# Patient Record
Sex: Female | Born: 1935 | Race: White | Hispanic: No | Smoking: Never smoker
Health system: Southern US, Community
[De-identification: ages and names within clinical notes are randomized; demographics above are authoritative.]

## PROBLEM LIST (undated history)

## (undated) DIAGNOSIS — I10 Essential (primary) hypertension: Secondary | ICD-10-CM

## (undated) HISTORY — PX: WRIST SURGERY: SHX841

## (undated) HISTORY — DX: Essential (primary) hypertension: I10

---

## 1998-11-17 ENCOUNTER — Other Ambulatory Visit: Admission: RE | Admit: 1998-11-17 | Discharge: 1998-11-17 | Payer: Self-pay | Admitting: *Deleted

## 1999-12-06 ENCOUNTER — Other Ambulatory Visit: Admission: RE | Admit: 1999-12-06 | Discharge: 1999-12-06 | Payer: Self-pay | Admitting: *Deleted

## 2000-12-10 ENCOUNTER — Other Ambulatory Visit: Admission: RE | Admit: 2000-12-10 | Discharge: 2000-12-10 | Payer: Self-pay | Admitting: Obstetrics and Gynecology

## 2001-12-13 ENCOUNTER — Other Ambulatory Visit: Admission: RE | Admit: 2001-12-13 | Discharge: 2001-12-13 | Payer: Self-pay | Admitting: Obstetrics and Gynecology

## 2002-03-13 ENCOUNTER — Ambulatory Visit (HOSPITAL_COMMUNITY): Admission: RE | Admit: 2002-03-13 | Discharge: 2002-03-13 | Payer: Self-pay | Admitting: Gastroenterology

## 2002-03-13 ENCOUNTER — Encounter (INDEPENDENT_AMBULATORY_CARE_PROVIDER_SITE_OTHER): Payer: Self-pay | Admitting: Specialist

## 2003-02-17 ENCOUNTER — Other Ambulatory Visit: Admission: RE | Admit: 2003-02-17 | Discharge: 2003-02-17 | Payer: Self-pay | Admitting: Obstetrics and Gynecology

## 2005-05-03 ENCOUNTER — Other Ambulatory Visit: Admission: RE | Admit: 2005-05-03 | Discharge: 2005-05-03 | Payer: Self-pay | Admitting: *Deleted

## 2006-05-08 ENCOUNTER — Other Ambulatory Visit: Admission: RE | Admit: 2006-05-08 | Discharge: 2006-05-08 | Payer: Self-pay | Admitting: Obstetrics and Gynecology

## 2007-05-14 ENCOUNTER — Other Ambulatory Visit: Admission: RE | Admit: 2007-05-14 | Discharge: 2007-05-14 | Payer: Self-pay | Admitting: Obstetrics and Gynecology

## 2008-05-14 ENCOUNTER — Other Ambulatory Visit: Admission: RE | Admit: 2008-05-14 | Discharge: 2008-05-14 | Payer: Self-pay | Admitting: Obstetrics and Gynecology

## 2009-04-09 ENCOUNTER — Encounter: Admission: RE | Admit: 2009-04-09 | Discharge: 2009-04-09 | Payer: Self-pay | Admitting: Internal Medicine

## 2010-05-19 ENCOUNTER — Encounter: Admission: RE | Admit: 2010-05-19 | Discharge: 2010-05-19 | Payer: Self-pay | Admitting: Internal Medicine

## 2010-06-02 HISTORY — PX: REPLACEMENT TOTAL KNEE: SUR1224

## 2010-06-13 ENCOUNTER — Inpatient Hospital Stay (HOSPITAL_COMMUNITY): Admission: RE | Admit: 2010-06-13 | Discharge: 2010-06-16 | Payer: Self-pay | Admitting: Orthopedic Surgery

## 2010-10-24 ENCOUNTER — Encounter: Payer: Self-pay | Admitting: Internal Medicine

## 2010-12-15 LAB — BASIC METABOLIC PANEL
BUN: 8 mg/dL (ref 6–23)
Calcium: 8.3 mg/dL — ABNORMAL LOW (ref 8.4–10.5)
Calcium: 8.3 mg/dL — ABNORMAL LOW (ref 8.4–10.5)
Creatinine, Ser: 0.55 mg/dL (ref 0.4–1.2)
Creatinine, Ser: 0.58 mg/dL (ref 0.4–1.2)
Creatinine, Ser: 0.63 mg/dL (ref 0.4–1.2)
GFR calc non Af Amer: >60 mL/min (ref >60)
GFR calc non Af Amer: >60 mL/min (ref >60)
Glucose, Bld: 93 mg/dL (ref 70–99)
Glucose, Bld: 130 mg/dL — ABNORMAL HIGH (ref 70–99)
Potassium: 3.5 mEq/L (ref 3.5–5.1)
Sodium: 126 mEq/L — ABNORMAL LOW (ref 135–145)
Sodium: 128 mEq/L — ABNORMAL LOW (ref 135–145)

## 2010-12-15 LAB — URINE CULTURE
Colony Count: NO GROWTH
Culture  Setup Time: 201109120901

## 2010-12-15 LAB — APTT: aPTT: 32 seconds (ref 24–37)

## 2010-12-15 LAB — CBC
HCT: 28 % — ABNORMAL LOW (ref 36.0–46.0)
HCT: 28.3 % — ABNORMAL LOW (ref 36.0–46.0)
HCT: 32.1 % — ABNORMAL LOW (ref 36.0–46.0)
Hemoglobin: 14.1 g/dL (ref 12.0–15.0)
MCH: 31.6 pg (ref 26.0–34.0)
MCHC: 35.2 g/dL (ref 30.0–36.0)
MCHC: 35.3 g/dL (ref 30.0–36.0)
MCV: 88.3 fL (ref 78.0–100.0)
MCV: 89.7 fL (ref 78.0–100.0)
Platelets: 197 10*3/uL (ref 150–400)
Platelets: 179 10*3/uL (ref 150–400)
Platelets: 197 10*3/uL (ref 150–400)
RBC: 3.19 MIL/uL — ABNORMAL LOW (ref 3.87–5.11)
RBC: 3.16 MIL/uL — ABNORMAL LOW (ref 3.87–5.11)
RBC: 3.58 MIL/uL — ABNORMAL LOW (ref 3.87–5.11)
RBC: 4.52 MIL/uL (ref 3.87–5.11)
RDW: 12.8 % (ref 11.5–15.5)
RDW: 12.8 % (ref 11.5–15.5)
WBC: 12.3 10*3/uL — ABNORMAL HIGH (ref 4.0–10.5)
WBC: 9.2 10*3/uL (ref 4.0–10.5)

## 2010-12-15 LAB — COMPREHENSIVE METABOLIC PANEL
ALT: 19 U/L (ref 0–35)
Alkaline Phosphatase: 74 U/L (ref 39–117)
BUN: 11 mg/dL (ref 6–23)
Chloride: 92 mEq/L — ABNORMAL LOW (ref 96–112)
Creatinine, Ser: 0.67 mg/dL (ref 0.4–1.2)
Potassium: 4.4 mEq/L (ref 3.5–5.1)
Sodium: 129 mEq/L — ABNORMAL LOW (ref 135–145)
Total Bilirubin: 0.3 mg/dL (ref 0.3–1.2)

## 2010-12-15 LAB — CROSSMATCH: ABO/RH(D): B POS

## 2010-12-15 LAB — SURGICAL PCR SCREEN
MRSA, PCR: NEGATIVE
Staphylococcus aureus: NEGATIVE

## 2010-12-15 LAB — ABO/RH: ABO/RH(D): B POS

## 2010-12-15 LAB — DIFFERENTIAL
Lymphs Abs: 2.6 10*3/uL (ref 0.7–4.0)
Monocytes Relative: 8 % (ref 3–12)
Neutro Abs: 5.7 10*3/uL (ref 1.7–7.7)

## 2011-02-17 NOTE — Procedures (Signed)
[facility]  Patient:    Kayla Ellis, Kayla Ellis Visit Number: [id number] MRN: [id number]          Service Type: END Location: ENDO Attending Physician:  Rich Brave Dictated by:   Florencia Reasons, M.D. Proc. Date: 03/13/02 Admit Date:  03/13/2002 Discharge Date: 03/13/2002   CC:         Erskine Speed, M.D.   Procedure Report  PROCEDURE:  Colonoscopy with biopsies.  SURGEON:  Florencia Reasons, M.D.  INDICATIONS:  A 77 year old female for colon cancer screening.  She does have a history of intermittent diarrhea.  FINDINGS:  Left-sided diverticulosis.  DESCRIPTION OF PROCEDURE:  The nature, purpose, and risks of the procedure had been discussed with the patient who provided written consent.  Sedation is fentanyl 50 mcg and Versed 6 mg IV without arrhythmias or desaturation.  The Olympus adjustable tension pediatric video colonoscope was easily advanced around the colon to the cecum, applying some external abdominal compression to get the tip of the scope to pass into the base of the cecum as identified by visualization of typical cecal appearance.  Pull back was then performed.  The quality of the prep was very good and it was felt that all areas were well-seen.  This was a normal examination, including retroflexed viewing in the rectum. No polyps, cancer, colitis, vascular malformations or diverticular disease were observed.  It is presumed that the patients intermittent diarrhea is due to irritable bowel syndrome, but random biopsies were obtained along the length of the colon to help exclude microscopic or collagenous colitis, and in so doing, the majority of the colon was briefly reinspected.  The patient tolerated the procedure well and without apparent complications.  IMPRESSION: 1. Left-sided diverticulosis. 2. No evidence of polyps or other precancerous lesions. 3. Source of diarrhea not endoscopically evident.  Suspect  irritable bowel    syndrome.  PLAN:  Await pathology to confirm the absence of microscopic or collagenous colitis, and the patient will be a candidate for follow up colonoscopy in ten years.  If she remains in good general health, and at the discretion of her primary physician, consideration could be given to flexible sigmoidoscopy five years from now, which would be covered by Medicare and would offer additional screening benefit. Dictated by:   Florencia Reasons, M.D. Attending Physician:  Rich Brave DD:  03/13/02 TD:  03/15/02 Job: [id number]

## 2011-06-14 ENCOUNTER — Other Ambulatory Visit: Payer: Self-pay | Admitting: Internal Medicine

## 2011-06-14 DIAGNOSIS — Z1231 Encounter for screening mammogram for malignant neoplasm of breast: Secondary | ICD-10-CM

## 2011-08-17 ENCOUNTER — Ambulatory Visit: Payer: Self-pay

## 2011-09-13 ENCOUNTER — Ambulatory Visit: Payer: Self-pay

## 2011-09-13 ENCOUNTER — Ambulatory Visit
Admission: RE | Admit: 2011-09-13 | Discharge: 2011-09-13 | Disposition: A | Discharge status: Home / Self Care | Payer: Medicare Other | Source: Ambulatory Visit | Attending: Internal Medicine | Admitting: Internal Medicine

## 2011-09-13 DIAGNOSIS — Z1231 Encounter for screening mammogram for malignant neoplasm of breast: Secondary | ICD-10-CM

## 2012-02-07 ENCOUNTER — Other Ambulatory Visit: Payer: Self-pay | Admitting: Internal Medicine

## 2012-02-07 DIAGNOSIS — Z780 Asymptomatic menopausal state: Secondary | ICD-10-CM

## 2012-02-07 DIAGNOSIS — Z1231 Encounter for screening mammogram for malignant neoplasm of breast: Secondary | ICD-10-CM

## 2012-03-25 ENCOUNTER — Other Ambulatory Visit: Payer: Self-pay | Admitting: Gastroenterology

## 2012-09-16 ENCOUNTER — Other Ambulatory Visit: Payer: Medicare Other

## 2012-09-16 ENCOUNTER — Ambulatory Visit
Admission: RE | Admit: 2012-09-16 | Discharge: 2012-09-16 | Disposition: A | Discharge status: Home / Self Care | Payer: Self-pay | Source: Ambulatory Visit | Attending: Internal Medicine | Admitting: Internal Medicine

## 2012-09-16 DIAGNOSIS — Z1231 Encounter for screening mammogram for malignant neoplasm of breast: Secondary | ICD-10-CM

## 2012-09-16 DIAGNOSIS — Z780 Asymptomatic menopausal state: Secondary | ICD-10-CM

## 2013-10-03 ENCOUNTER — Other Ambulatory Visit: Payer: Self-pay

## 2013-10-03 DIAGNOSIS — Z1231 Encounter for screening mammogram for malignant neoplasm of breast: Secondary | ICD-10-CM

## 2013-10-28 ENCOUNTER — Ambulatory Visit
Admission: RE | Admit: 2013-10-28 | Discharge: 2013-10-28 | Disposition: A | Discharge status: Home / Self Care | Payer: Medicare Other | Source: Ambulatory Visit

## 2013-10-28 DIAGNOSIS — Z1231 Encounter for screening mammogram for malignant neoplasm of breast: Secondary | ICD-10-CM

## 2013-12-10 ENCOUNTER — Encounter: Payer: Self-pay | Admitting: Obstetrics & Gynecology

## 2013-12-10 ENCOUNTER — Ambulatory Visit: Payer: Self-pay | Admitting: Obstetrics and Gynecology

## 2013-12-11 ENCOUNTER — Encounter: Payer: Self-pay | Admitting: Obstetrics & Gynecology

## 2013-12-11 ENCOUNTER — Ambulatory Visit (INDEPENDENT_AMBULATORY_CARE_PROVIDER_SITE_OTHER): Payer: Medicare Other | Admitting: Obstetrics & Gynecology

## 2013-12-11 VITALS — BP 138/82 | HR 55 | Resp 18 | Ht 63.0 in | Wt 178.0 lb

## 2013-12-11 DIAGNOSIS — Z124 Encounter for screening for malignant neoplasm of cervix: Secondary | ICD-10-CM

## 2013-12-11 DIAGNOSIS — Z01419 Encounter for gynecological examination (general) (routine) without abnormal findings: Secondary | ICD-10-CM

## 2013-12-11 DIAGNOSIS — R1031 Right lower quadrant pain: Secondary | ICD-10-CM

## 2013-12-11 NOTE — Progress Notes (Signed)
80 y.o. G1P1 MarriedCaucasianF here for annual exam.  No vaginal bleeding.  After standing for extended periods of time, has some RLQ pain. No nausea, diarrhea, or constipation.  Has been going on for about six months.  Hasn't really complained of this to anyone else except her husband.  Patient's last menstrual period was 10/02/1982.          Sexually active: no  The current method of family planning is none.    Exercising: yes  Walking a mile 5 days a week Smoker:  no  Health Maintenance: Pap:  05/2009 - Neg History of abnormal Pap:  no MMG:  10/29/2013 BI-RADS1: Neg Colonoscopy:  06/2012.  One polyp.  Dr. Cristina Gong.  F/u 5 years. BMD:   05/2010 TDaP:  2013 - PCP Screening Labs: PCP, Hb today: PCP, Urine today: PCP   reports that she has never smoked. She has never used smokeless tobacco. She reports that she does not drink alcohol or use illicit drugs.  Past Medical History  Diagnosis Date  . Hypertension     Past Surgical History  Procedure Laterality Date  . Wrist surgery      right  . Replacement total knee      Current Outpatient Prescriptions  Medication Sig Dispense Refill  . Calcium Carb-Cholecalciferol (CALCIUM 600 + D PO) Take by mouth 2 (two) times daily.      . carvedilol (COREG) 25 MG tablet Take 25 mg by mouth 2 (two) times daily with a meal.       . desonide (DESOWEN) 0.05 % cream Apply 1 application topically 2 (two) times daily.      . diphenhydramine-acetaminophen (TYLENOL PM) 25-500 MG TABS Take 1 tablet by mouth at bedtime as needed.      Marland Kitchen estradiol (ESTRACE) 0.1 MG/GM vaginal cream Place 1 Applicatorful vaginally as needed.       . FUROSEMIDE PO Take 40 mg by mouth daily.       Marland Kitchen levothyroxine (SYNTHROID, LEVOTHROID) 50 MCG tablet Take 1 tablet by mouth daily.      Marland Kitchen losartan (COZAAR) 100 MG tablet Take 100 mg by mouth daily.       No current facility-administered medications for this visit.    Family History  Problem Relation Age of Onset  .  Cancer Mother     vulvar  . Alzheimer's disease Mother   . Lung cancer Father   . Heart attack Brother   . Emphysema Father     ROS:  Pertinent items are noted in HPI.  Otherwise, a comprehensive ROS was negative.  Exam:   BP 138/82  Pulse 55  Resp 18  Ht 5\' 3"  (1.6 m)  Wt 178 lb (80.74 kg)  BMI 31.54 kg/m2  LMP 10/02/1982  Weight change: -6lb  Height: 5\' 3"  (160 cm)  Ht Readings from Last 3 Encounters:  12/11/13 5\' 3"  (1.6 m)    General appearance: alert, cooperative and appears stated age Head: Normocephalic, without obvious abnormality, atraumatic Neck: no adenopathy, supple, symmetrical, trachea midline and thyroid normal to inspection and palpation Lungs: clear to auscultation bilaterally Breasts: normal appearance, no masses or tenderness Heart: regular rate and rhythm Abdomen: soft, non-tender; bowel sounds normal; no masses,  no organomegaly Extremities: extremities normal, atraumatic, no cyanosis or edema Skin: Skin color, texture, turgor normal. No rashes or lesions Lymph nodes: Cervical, supraclavicular, and axillary nodes normal. No abnormal inguinal nodes palpated Neurologic: Grossly normal   Pelvic: External genitalia:  no lesions  Urethra:  normal appearing urethra with no masses, tenderness or lesions              Bartholins and Skenes: normal                 Vagina: normal appearing vagina with normal color and discharge, no lesions              Cervix: no lesions              Pap taken: yes Bimanual Exam:  Uterus:  normal size, contour, position, consistency, mobility, non-tender              Adnexa: normal adnexa and no mass, fullness, tenderness               Rectovaginal: Confirms               Anus:  normal sphincter tone, no lesions  A:  Well Woman with normal exam PMP, no HRT Hypertension Intermittent RLQ pain, primarily with extended standing.  P:   Mammogram yearly pap smear today Plan PUS just to ensure ovaries are normal.    return annually or prn  An After Visit Summary was printed and given to the patient.

## 2013-12-11 NOTE — Progress Notes (Signed)
Pelvic U/S scheduled and patient aware/agreeable to time.  Patient verbalized understanding of the U/S appointment cancellation policy. Advised will need to cancel within 72 business hours (3 business days) or will have $100.00 no show fee placed to account.  Scheduled for 3/19 at 1530

## 2013-12-15 LAB — IPS PAP SMEAR ONLY

## 2013-12-18 ENCOUNTER — Ambulatory Visit (INDEPENDENT_AMBULATORY_CARE_PROVIDER_SITE_OTHER): Payer: Medicare Other

## 2013-12-18 ENCOUNTER — Ambulatory Visit (INDEPENDENT_AMBULATORY_CARE_PROVIDER_SITE_OTHER): Payer: Medicare Other | Admitting: Obstetrics & Gynecology

## 2013-12-18 VITALS — BP 122/82 | Ht 63.0 in | Wt 176.0 lb

## 2013-12-18 DIAGNOSIS — R1031 Right lower quadrant pain: Secondary | ICD-10-CM

## 2013-12-18 DIAGNOSIS — N83209 Unspecified ovarian cyst, unspecified side: Secondary | ICD-10-CM

## 2013-12-18 NOTE — Progress Notes (Signed)
  80 y.o.Marriedfemale here for a pelvic ultrasound.    Patient's last menstrual period was 10/02/1982.  Sexually active:  no  Contraception: PMP state  FINDINGS: UTERUS: 4.6 x 3.6 x 2.3cm EMS: 3.64mm ADNEXA:   Left ovary 2.7 x 1.8 x 0.9cm with 1.8cm smooth, echofree and avascular cyst noted   Right ovary 1.7 x 1.0 x 1.2cm CUL DE SAC: no free fluid  Images reviewed with patient.  All questions answered.  Has a small, benign appearing cyst that would not be source of pain.  Feel her pain may be joint related and I would like for her to see her orthopedist.  H/O Right total knee so this pain could be hip related.  She is reassured by ultrasound today and will discuss at next appt.  Will also let me know if anything changes.    Assessment:  RLQ pain Plan: small 1.8cm left ovarian cyst, benign in appearance Pt will follow up with ortho  ~15 minutes spent with patient >50% of time was in face to face discussion of above.

## 2013-12-30 ENCOUNTER — Encounter: Payer: Self-pay | Admitting: Obstetrics & Gynecology

## 2014-05-02 HISTORY — PX: CATARACT EXTRACTION: SUR2

## 2014-07-02 HISTORY — PX: CATARACT EXTRACTION: SUR2

## 2014-08-03 ENCOUNTER — Encounter: Payer: Self-pay | Admitting: Obstetrics & Gynecology

## 2014-10-12 DIAGNOSIS — H01021 Squamous blepharitis right upper eyelid: Secondary | ICD-10-CM | POA: Diagnosis not present

## 2014-10-12 DIAGNOSIS — H01024 Squamous blepharitis left upper eyelid: Secondary | ICD-10-CM | POA: Diagnosis not present

## 2014-10-12 DIAGNOSIS — H109 Unspecified conjunctivitis: Secondary | ICD-10-CM | POA: Diagnosis not present

## 2014-10-12 DIAGNOSIS — I10 Essential (primary) hypertension: Secondary | ICD-10-CM | POA: Diagnosis not present

## 2014-10-12 DIAGNOSIS — H01025 Squamous blepharitis left lower eyelid: Secondary | ICD-10-CM | POA: Diagnosis not present

## 2014-10-12 DIAGNOSIS — H01022 Squamous blepharitis right lower eyelid: Secondary | ICD-10-CM | POA: Diagnosis not present

## 2014-10-28 DIAGNOSIS — H01022 Squamous blepharitis right lower eyelid: Secondary | ICD-10-CM | POA: Diagnosis not present

## 2014-10-28 DIAGNOSIS — H0012 Chalazion right lower eyelid: Secondary | ICD-10-CM | POA: Diagnosis not present

## 2014-10-28 DIAGNOSIS — H01024 Squamous blepharitis left upper eyelid: Secondary | ICD-10-CM | POA: Diagnosis not present

## 2014-10-28 DIAGNOSIS — H01021 Squamous blepharitis right upper eyelid: Secondary | ICD-10-CM | POA: Diagnosis not present

## 2014-10-28 DIAGNOSIS — H01025 Squamous blepharitis left lower eyelid: Secondary | ICD-10-CM | POA: Diagnosis not present

## 2014-11-19 DIAGNOSIS — Z23 Encounter for immunization: Secondary | ICD-10-CM | POA: Diagnosis not present

## 2014-11-19 DIAGNOSIS — I10 Essential (primary) hypertension: Secondary | ICD-10-CM | POA: Diagnosis not present

## 2014-12-18 ENCOUNTER — Ambulatory Visit (INDEPENDENT_AMBULATORY_CARE_PROVIDER_SITE_OTHER): Payer: Medicare Other | Admitting: Obstetrics & Gynecology

## 2014-12-18 ENCOUNTER — Encounter: Payer: Self-pay | Admitting: Obstetrics & Gynecology

## 2014-12-18 VITALS — BP 136/84 | HR 74 | Resp 18 | Ht 63.0 in | Wt 172.6 lb

## 2014-12-18 DIAGNOSIS — Z01419 Encounter for gynecological examination (general) (routine) without abnormal findings: Secondary | ICD-10-CM | POA: Diagnosis not present

## 2014-12-18 NOTE — Progress Notes (Signed)
81 y.o. G1P1 MarriedCaucasianF here for annual exam.  Doing well.  Now on four blood pressure medications.  Was followed closely for a few weeks after the Norvasc was added.  She checks her BPs at home and the level is usually in the 130/70-80.    Denies vaginal bleeding.    PCP:  Dr. Nyoka Cowden.  Goes every three months.   Patient's last menstrual period was 10/02/1982.          Sexually active: No.  The current method of family planning is post menopausal status.    Exercising: Yes.    Walking a mile most days Smoker:  no  Health Maintenance: Pap:  12/11/13 Negative  History of abnormal Pap:  no MMG:  10/29/13 Breast Density Category B; Bi-Rads 1: Negative  Colonoscopy:  6/242013 Polyp found- f/u in 2018 BMD:   05/19/10  TDaP:  Up to date Screening Labs: Levin Erp, MD has physical scheduled for May 2016 has labs and urine done with him.   reports that she has never smoked. She has never used smokeless tobacco. She reports that she does not drink alcohol or use illicit drugs.  Past Medical History  Diagnosis Date  . Hypertension     Past Surgical History  Procedure Laterality Date  . Wrist surgery      right  . Replacement total knee  9/11    Dr. Lorre Nick    Current Outpatient Prescriptions  Medication Sig Dispense Refill  . amLODipine (NORVASC) 5 MG tablet Take 5 mg by mouth daily.    . Calcium Carb-Cholecalciferol (CALCIUM 600 + D PO) Take by mouth 2 (two) times daily.    . carvedilol (COREG) 25 MG tablet Take 25 mg by mouth 2 (two) times daily with a meal.     . diphenhydramine-acetaminophen (TYLENOL PM) 25-500 MG TABS Take 1 tablet by mouth at bedtime as needed.    . FUROSEMIDE PO Take 40 mg by mouth daily.     Marland Kitchen levothyroxine (SYNTHROID, LEVOTHROID) 50 MCG tablet Take 1 tablet by mouth daily.    Marland Kitchen losartan (COZAAR) 100 MG tablet Take 100 mg by mouth daily.    Marland Kitchen desonide (DESOWEN) 0.05 % cream Apply 1 application topically 2 (two) times daily.    Marland Kitchen estradiol (ESTRACE) 0.1  MG/GM vaginal cream Place 1 Applicatorful vaginally as needed.      No current facility-administered medications for this visit.    Family History  Problem Relation Age of Onset  . Cancer Mother     vulvar  . Alzheimer's disease Mother   . Lung cancer Father   . Heart attack Brother   . Emphysema Father     ROS:  Pertinent items are noted in HPI.  Otherwise, a comprehensive ROS was negative.  Exam:   BP 136/84 mmHg  Pulse 74  Resp 18  Ht 5\' 3"  (1.6 m)  Wt 172 lb 9.6 oz (78.291 kg)  BMI 30.58 kg/m2  LMP 10/02/1982  Height: 5\' 3"  (160 cm)  Ht Readings from Last 3 Encounters:  12/18/14 5\' 3"  (1.6 m)  12/18/13 5\' 3"  (1.6 m)  12/11/13 5\' 3"  (1.6 m)    General appearance: alert, cooperative and appears stated age Head: Normocephalic, without obvious abnormality, atraumatic Neck: no adenopathy, supple, symmetrical, trachea midline and thyroid normal to inspection and palpation Lungs: clear to auscultation bilaterally Breasts: normal appearance, no masses or tenderness Heart: regular rate and rhythm Abdomen: soft, non-tender; bowel sounds normal; no masses,  no organomegaly Extremities:  extremities normal, atraumatic, no cyanosis or edema Skin: Skin color, texture, turgor normal. No rashes or lesions Lymph nodes: Cervical, supraclavicular, and axillary nodes normal. No abnormal inguinal nodes palpated Neurologic: Grossly normal   Pelvic: External genitalia:  no lesions              Urethra:  normal appearing urethra with no masses, tenderness or lesions              Bartholins and Skenes: normal                 Vagina: normal appearing vagina with normal color and discharge, no lesions              Cervix: no lesions              Pap taken: No. Bimanual Exam:  Uterus:  normal size, contour, position, consistency, mobility, non-tender              Adnexa: normal adnexa and no mass, fullness, tenderness               Rectovaginal: Confirms               Anus:  normal  sphincter tone, no lesions  Chaperone was present for exam.  A:  Well Woman with normal exam PMP, no HRT Hypertension  P: Mammogram yearly.  Pt aware is due.  States she will schedule. pap smear 3/15.  No Pap today. Labs and vaccines with Dr. Nyoka Cowden. return annually or prn

## 2015-01-22 ENCOUNTER — Other Ambulatory Visit: Payer: Self-pay

## 2015-01-22 DIAGNOSIS — Z1231 Encounter for screening mammogram for malignant neoplasm of breast: Secondary | ICD-10-CM

## 2015-01-28 ENCOUNTER — Ambulatory Visit
Admission: RE | Admit: 2015-01-28 | Discharge: 2015-01-28 | Disposition: A | Discharge status: Home / Self Care | Payer: Medicare Other | Source: Ambulatory Visit

## 2015-01-28 DIAGNOSIS — Z1231 Encounter for screening mammogram for malignant neoplasm of breast: Secondary | ICD-10-CM | POA: Diagnosis not present

## 2015-02-22 DIAGNOSIS — E039 Hypothyroidism, unspecified: Secondary | ICD-10-CM | POA: Diagnosis not present

## 2015-02-22 DIAGNOSIS — I10 Essential (primary) hypertension: Secondary | ICD-10-CM | POA: Diagnosis not present

## 2015-02-22 DIAGNOSIS — Z0000 Encounter for general adult medical examination without abnormal findings: Secondary | ICD-10-CM | POA: Diagnosis not present

## 2015-03-16 DIAGNOSIS — I10 Essential (primary) hypertension: Secondary | ICD-10-CM | POA: Diagnosis not present

## 2015-06-14 ENCOUNTER — Other Ambulatory Visit: Payer: Self-pay | Admitting: Dermatology

## 2015-06-14 DIAGNOSIS — C44722 Squamous cell carcinoma of skin of right lower limb, including hip: Secondary | ICD-10-CM | POA: Diagnosis not present

## 2015-06-14 DIAGNOSIS — C44721 Squamous cell carcinoma of skin of unspecified lower limb, including hip: Secondary | ICD-10-CM | POA: Diagnosis not present

## 2015-06-14 DIAGNOSIS — L309 Dermatitis, unspecified: Secondary | ICD-10-CM | POA: Diagnosis not present

## 2015-06-14 DIAGNOSIS — D239 Other benign neoplasm of skin, unspecified: Secondary | ICD-10-CM | POA: Diagnosis not present

## 2015-06-22 DIAGNOSIS — Z23 Encounter for immunization: Secondary | ICD-10-CM | POA: Diagnosis not present

## 2015-07-13 DIAGNOSIS — Z96651 Presence of right artificial knee joint: Secondary | ICD-10-CM | POA: Diagnosis not present

## 2015-07-13 DIAGNOSIS — Z471 Aftercare following joint replacement surgery: Secondary | ICD-10-CM | POA: Diagnosis not present

## 2015-07-22 DIAGNOSIS — C44722 Squamous cell carcinoma of skin of right lower limb, including hip: Secondary | ICD-10-CM | POA: Diagnosis not present

## 2015-08-10 DIAGNOSIS — H1851 Endothelial corneal dystrophy: Secondary | ICD-10-CM | POA: Diagnosis not present

## 2015-08-10 DIAGNOSIS — H01021 Squamous blepharitis right upper eyelid: Secondary | ICD-10-CM | POA: Diagnosis not present

## 2015-08-10 DIAGNOSIS — H01022 Squamous blepharitis right lower eyelid: Secondary | ICD-10-CM | POA: Diagnosis not present

## 2015-08-10 DIAGNOSIS — H04123 Dry eye syndrome of bilateral lacrimal glands: Secondary | ICD-10-CM | POA: Diagnosis not present

## 2015-08-10 DIAGNOSIS — D3131 Benign neoplasm of right choroid: Secondary | ICD-10-CM | POA: Diagnosis not present

## 2015-09-09 DIAGNOSIS — I10 Essential (primary) hypertension: Secondary | ICD-10-CM | POA: Diagnosis not present

## 2015-09-28 DIAGNOSIS — S8002XA Contusion of left knee, initial encounter: Secondary | ICD-10-CM | POA: Diagnosis not present

## 2015-11-02 DIAGNOSIS — M25562 Pain in left knee: Secondary | ICD-10-CM | POA: Diagnosis not present

## 2015-11-02 DIAGNOSIS — M1712 Unilateral primary osteoarthritis, left knee: Secondary | ICD-10-CM | POA: Diagnosis not present

## 2015-11-02 DIAGNOSIS — M179 Osteoarthritis of knee, unspecified: Secondary | ICD-10-CM | POA: Diagnosis not present

## 2016-02-23 ENCOUNTER — Ambulatory Visit
Admission: RE | Admit: 2016-02-23 | Discharge: 2016-02-23 | Disposition: A | Discharge status: Home / Self Care | Payer: Medicare Other | Source: Ambulatory Visit | Attending: Internal Medicine | Admitting: Internal Medicine

## 2016-02-23 ENCOUNTER — Other Ambulatory Visit: Payer: Self-pay | Admitting: Internal Medicine

## 2016-02-23 DIAGNOSIS — R062 Wheezing: Secondary | ICD-10-CM

## 2016-02-23 DIAGNOSIS — J9811 Atelectasis: Secondary | ICD-10-CM | POA: Diagnosis not present

## 2016-02-23 DIAGNOSIS — Z0000 Encounter for general adult medical examination without abnormal findings: Secondary | ICD-10-CM | POA: Diagnosis not present

## 2016-02-23 DIAGNOSIS — I119 Hypertensive heart disease without heart failure: Secondary | ICD-10-CM | POA: Diagnosis not present

## 2016-02-23 DIAGNOSIS — E039 Hypothyroidism, unspecified: Secondary | ICD-10-CM | POA: Diagnosis not present

## 2016-02-23 DIAGNOSIS — E7800 Pure hypercholesterolemia, unspecified: Secondary | ICD-10-CM | POA: Diagnosis not present

## 2016-02-23 DIAGNOSIS — D559 Anemia due to enzyme disorder, unspecified: Secondary | ICD-10-CM | POA: Diagnosis not present

## 2016-02-23 IMAGING — CR DG CHEST 2V
2 series · 2 of 2 positions shown · non-contrast
Comparison: [DATE].

CLINICAL DATA: Abnormal breath sounds.

EXAM:
CHEST  2 VIEW

[w chest pa]
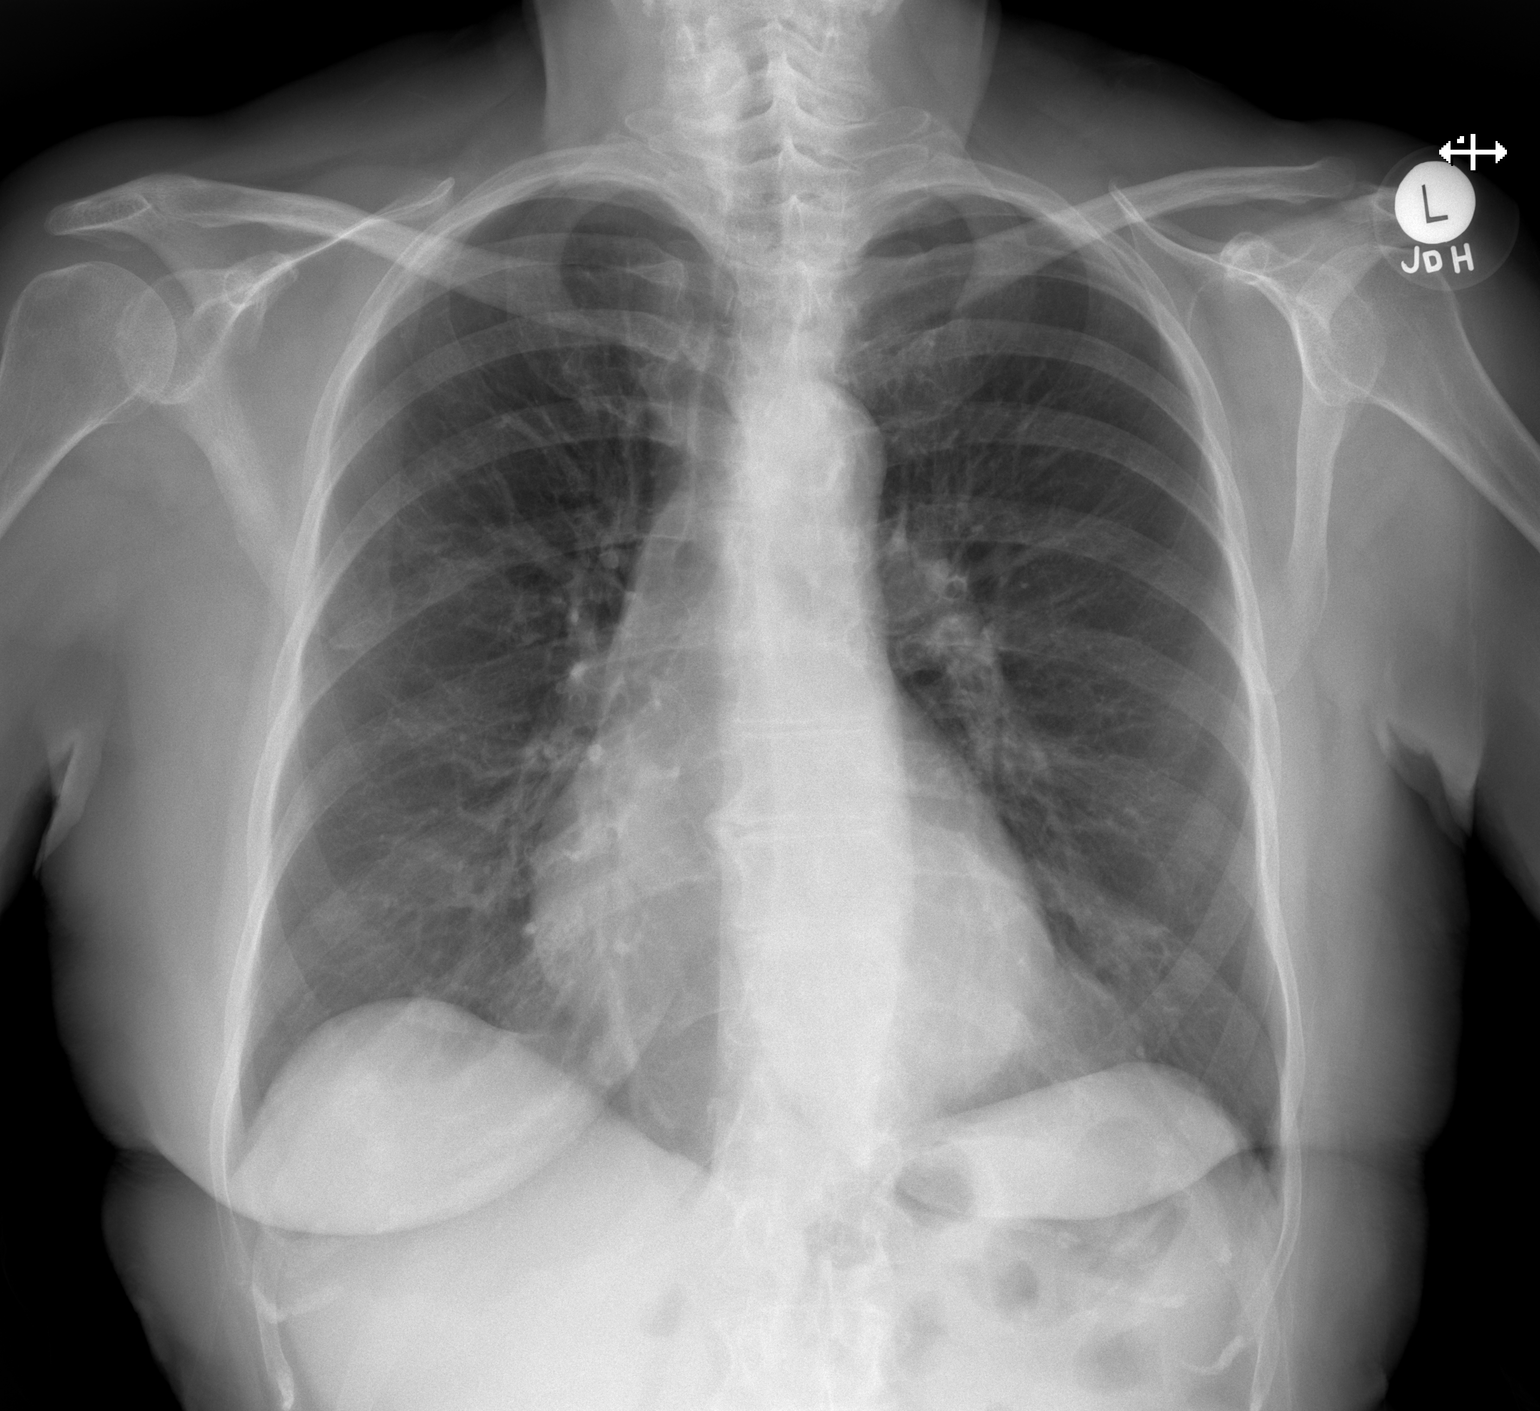

[w chest lat]
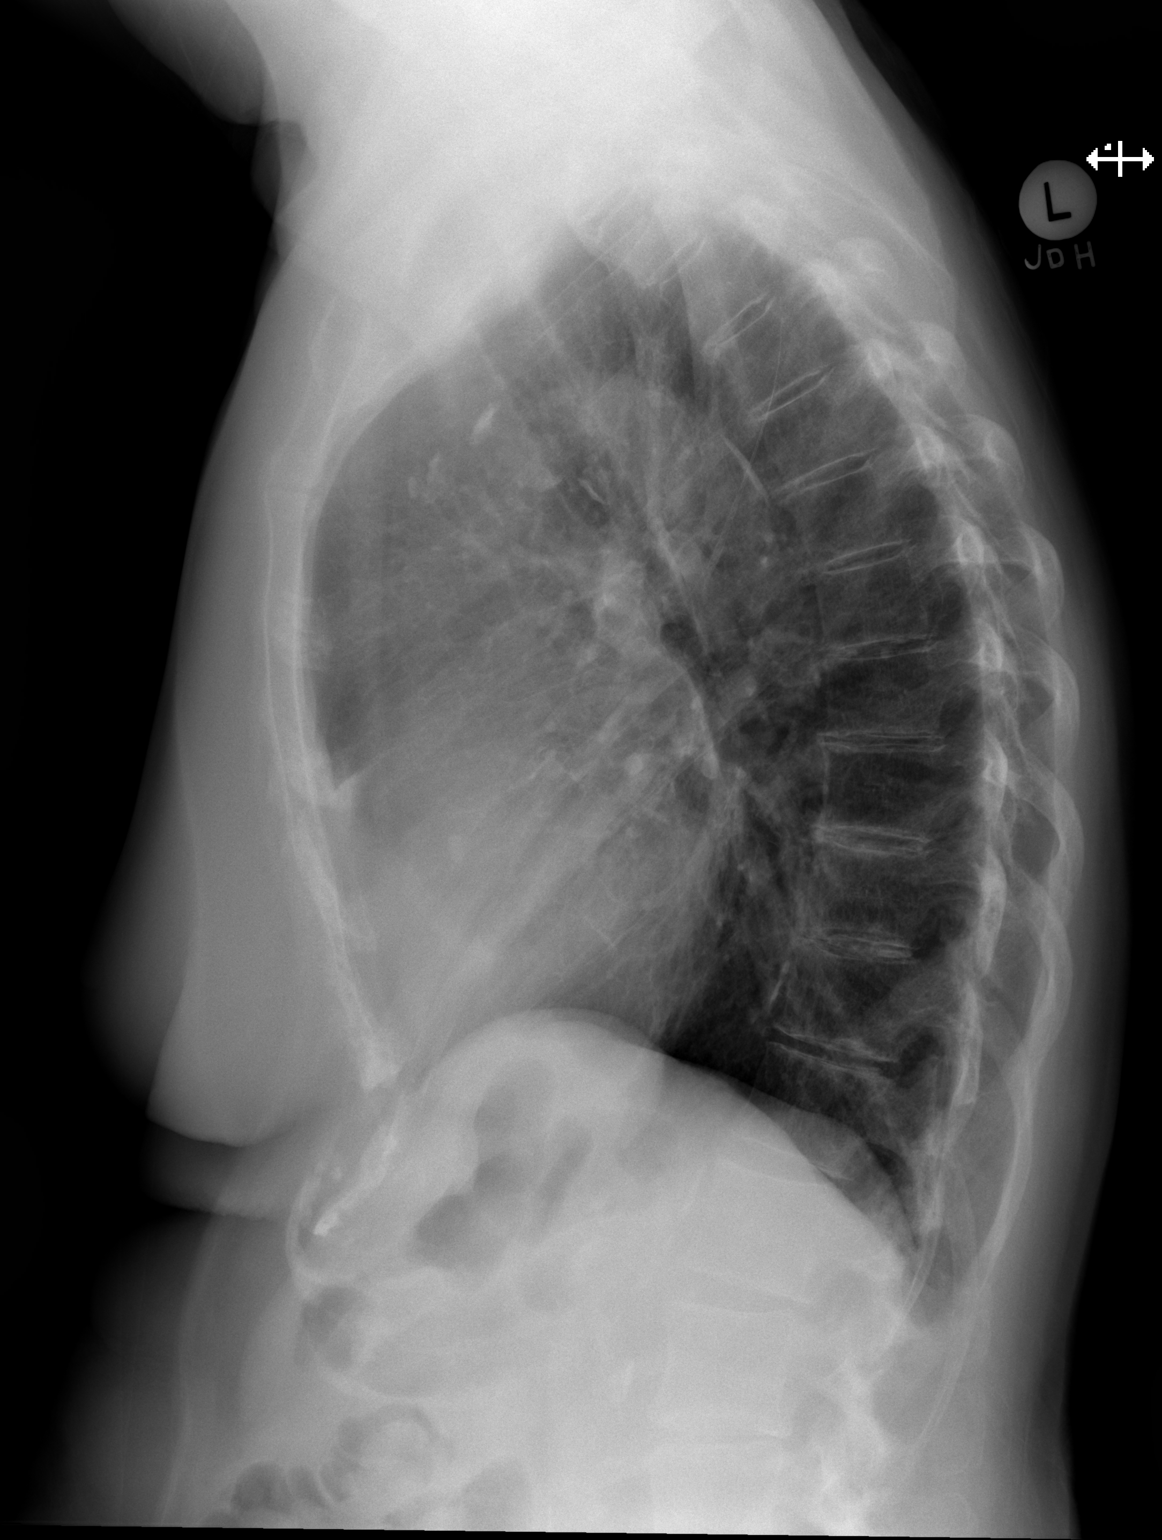

[2 of 2 positions shown; findings below may reference images not displayed]

FINDINGS: Mediastinum hilar structures are normal. Low lung volumes with mild
bibasilar atelectasis. No pleural effusion pneumothorax. Mild
cardiomegaly, stable. No pulmonary venous congestion. Degenerative
changes thoracic spine .
IMPRESSION: Low lung volumes with mild bibasilar atelectasis.

## 2016-03-02 ENCOUNTER — Ambulatory Visit: Payer: Medicare Other | Admitting: Obstetrics & Gynecology

## 2016-03-09 DIAGNOSIS — I119 Hypertensive heart disease without heart failure: Secondary | ICD-10-CM | POA: Diagnosis not present

## 2016-03-09 DIAGNOSIS — J302 Other seasonal allergic rhinitis: Secondary | ICD-10-CM | POA: Diagnosis not present

## 2016-03-31 ENCOUNTER — Other Ambulatory Visit: Payer: Self-pay | Admitting: Internal Medicine

## 2016-03-31 DIAGNOSIS — Z1231 Encounter for screening mammogram for malignant neoplasm of breast: Secondary | ICD-10-CM

## 2016-04-12 ENCOUNTER — Ambulatory Visit
Admission: RE | Admit: 2016-04-12 | Discharge: 2016-04-12 | Disposition: A | Discharge status: Home / Self Care | Payer: Medicare Other | Source: Ambulatory Visit | Attending: Internal Medicine | Admitting: Internal Medicine

## 2016-04-12 DIAGNOSIS — Z1231 Encounter for screening mammogram for malignant neoplasm of breast: Secondary | ICD-10-CM

## 2016-06-26 DIAGNOSIS — Z23 Encounter for immunization: Secondary | ICD-10-CM | POA: Diagnosis not present

## 2016-08-09 DIAGNOSIS — I119 Hypertensive heart disease without heart failure: Secondary | ICD-10-CM | POA: Diagnosis not present

## 2016-12-11 ENCOUNTER — Other Ambulatory Visit: Payer: Self-pay | Admitting: Dermatology

## 2017-08-31 ENCOUNTER — Other Ambulatory Visit: Payer: Self-pay | Admitting: Internal Medicine

## 2017-08-31 DIAGNOSIS — Z1231 Encounter for screening mammogram for malignant neoplasm of breast: Secondary | ICD-10-CM

## 2017-10-03 ENCOUNTER — Ambulatory Visit
Admission: RE | Admit: 2017-10-03 | Discharge: 2017-10-03 | Disposition: A | Discharge status: Home / Self Care | Payer: Medicare Other | Source: Ambulatory Visit | Attending: Internal Medicine | Admitting: Internal Medicine

## 2017-10-03 DIAGNOSIS — Z1231 Encounter for screening mammogram for malignant neoplasm of breast: Secondary | ICD-10-CM

## 2017-11-19 ENCOUNTER — Telehealth: Payer: Self-pay | Admitting: Obstetrics & Gynecology

## 2017-11-19 NOTE — Telephone Encounter (Signed)
Patient called and requested an appointment with Dr. Sabra Heck for pain in the lower right side.  Last seen: 12/18/2014

## 2017-11-19 NOTE — Telephone Encounter (Signed)
Spoke with patient. Patient states that she has been having intermittent right sided pelvic pain for months. States it occurs a couple times a week. Pain is debilitating when it occurs. No pain at this time. Reports it is very low on the right side. Not in abdomen. Advised will need to be seen for further evaluation. Appointment scheduled for 11/21/2017 at 9 am with Dr.Miller. Declines earlier appointment.  Routing to provider for final review. Patient agreeable to disposition. Will close encounter.

## 2017-11-21 ENCOUNTER — Ambulatory Visit: Payer: Medicare Other | Admitting: Obstetrics & Gynecology

## 2017-11-22 ENCOUNTER — Encounter: Payer: Self-pay | Admitting: Obstetrics & Gynecology

## 2017-11-22 ENCOUNTER — Ambulatory Visit (INDEPENDENT_AMBULATORY_CARE_PROVIDER_SITE_OTHER): Payer: Medicare Other | Admitting: Obstetrics & Gynecology

## 2017-11-22 VITALS — BP 120/60 | HR 68 | Resp 16 | Wt 159.0 lb

## 2017-11-22 DIAGNOSIS — R1031 Right lower quadrant pain: Secondary | ICD-10-CM | POA: Diagnosis not present

## 2017-11-22 NOTE — Progress Notes (Signed)
GYNECOLOGY  VISIT  CC:   RLQ pain  HPI: 84 y.o. G1P1 Married Caucasian female here for episodic RLQ.  At times, the pain can be quite severe.  Feels like this has been present over the past year almost every day.  Reports the last three days have been pretty good and she really doesn't hurt but knows the pain will come back.    Denies nausea, vomiting, diarrhea or constipation.  Has last 12 pounds in the last three years.  Reports she's made an attempt to eat healthier.    I last saw pt in 2016 but in 2015, she had a complaint of RLQ pain.  PUS was normal then except for 1.8cm cyst on left ovary.  This had only simple features.  Pt thinks this pain is the same as then but now is much more constant.    Reports she spoke with PCP, Dr. Nyoka Cowden, about this about five months ago.  No specific recommendations were made at that time.  Pt's husband is with her today as he was not sure she was going to be honest about the amount of pain she is experiencing.   Denies vaginal bleeding or discharge.  Denies pelvic or abdominal pain.  Does have pain with external rotation of right hip but denies worsening pain with extended standing or walking.  Cannot identify anything that really make the RLQ pain better or worse.    GYNECOLOGIC HISTORY: Patient's last menstrual period was 10/02/1982. Contraception: post menopausal  Menopausal hormone therapy: none  There are no active problems to display for this patient.   Past Medical History:  Diagnosis Date  . Hypertension     Past Surgical History:  Procedure Laterality Date  . CATARACT EXTRACTION Right 8/15   Dr. Katy Fitch  . CATARACT EXTRACTION Left 10/15   Dr. Katy Fitch  . REPLACEMENT TOTAL KNEE  9/11   Dr. Lorre Nick  . WRIST SURGERY     right    MEDS:   Current Outpatient Medications on File Prior to Visit  Medication Sig Dispense Refill  . amLODipine (NORVASC) 5 MG tablet Take 5 mg by mouth daily.    . Calcium Carb-Cholecalciferol (CALCIUM 600 + D PO)  Take by mouth 2 (two) times daily.    . carvedilol (COREG) 25 MG tablet Take 25 mg by mouth 2 (two) times daily with a meal.     . desonide (DESOWEN) 0.05 % cream Apply 1 application topically 2 (two) times daily.    . diphenhydramine-acetaminophen (TYLENOL PM) 25-500 MG TABS Take 1 tablet by mouth at bedtime as needed.    . doxazosin (CARDURA) 1 MG tablet Take 1 mg by mouth at bedtime.  5  . furosemide (LASIX) 40 MG tablet Take 1 tablet by mouth daily.    Marland Kitchen levothyroxine (SYNTHROID, LEVOTHROID) 50 MCG tablet Take 1 tablet by mouth daily.    Marland Kitchen losartan (COZAAR) 100 MG tablet Take 100 mg by mouth daily.     No current facility-administered medications on file prior to visit.     ALLERGIES: Patient has no known allergies.  Family History  Problem Relation Age of Onset  . Cancer Mother        vulvar  . Alzheimer's disease Mother   . Lung cancer Father   . Emphysema Father   . Heart attack Brother     SH:  Married, non smoker[  Review of Systems  Gastrointestinal: Positive for abdominal pain.  All other systems reviewed and are negative.  PHYSICAL EXAMINATION:    BP 120/60 (BP Location: Right Arm, Patient Position: Sitting, Cuff Size: Normal)   Pulse 68   Resp 16   Wt 159 lb (72.1 kg)   LMP 10/02/1982   BMI 28.17 kg/m     General appearance: alert, cooperative and appears stated age Neck: no adenopathy, supple, symmetrical, trachea midline and thyroid normal to inspection and palpation CV:  Regular rate and rhythm Lungs:  clear to auscultation, no wheezes, rales or rhonchi, symmetric air entry Abdomen: soft, RLQ tenderness to palpation--very low almost in inguinal region; bowel sounds normal; no masses,  no organomegaly  Pelvic: External genitalia:  no lesions              Urethra:  normal appearing urethra with no masses, tenderness or lesions              Bartholins and Skenes: normal                 Vagina: normal appearing vagina with normal color and discharge, no  lesions              Cervix: no lesions              Bimanual Exam:  Uterus:  normal size, contour, position, consistency, mobility, non-tender              Adnexa: no mass, fullness, tenderness              Rectovaginal: Yes.  .  Confirms.              Anus:  normal sphincter tone, no lesions  Chaperone was present for exam.  Assessment: RLQ/inguinal region pain, at times severe Purposeful weight loss  Plan: Plan CT abdomen and pelvic, possible hernia CMP ordered for renal function before CT can/will be done.  May need to send pt to general surgeon if CT is negative.  Pt voices understanding.    ~25 minutes spent with patient >50% of time was in face to face discussion of above.

## 2017-11-22 NOTE — Progress Notes (Signed)
Patient scheduled while in office, spouse present. Spoke with [place] at [place]. Scheduled patient for CT abdomen and pelvis with contrast for RLQ abdominal pain, r/o hernia. Patient scheduled for 12/03/17 arriving at 3:10pm for 3:30pm appointment at [address] Location. Patient will need to pick up oral contrast prior to appointment, no solids 4 hrs prior to appt, liquids and meds OK. Will have IV for contrast. BMP drawn in office today.   Patient and spouse verbalize understanding and are agreeable. Written instructions and contact information also provided.

## 2017-11-23 LAB — BASIC METABOLIC PANEL
BUN/Creatinine Ratio: 17 (ref 12–28)
BUN: 14 mg/dL (ref 8–27)
CALCIUM: 9.7 mg/dL (ref 8.7–10.3)
CO2: 25 mmol/L (ref 20–29)
Chloride: 99 mmol/L (ref 96–106)
Creatinine, Ser: 0.81 mg/dL (ref 0.57–1.00)
GFR calc non Af Amer: 68 mL/min/{1.73_m2} (ref >59)
GFR, EST AFRICAN AMERICAN: 79 mL/min/{1.73_m2} (ref >59)
GLUCOSE: 80 mg/dL (ref 65–99)
POTASSIUM: 4.1 mmol/L (ref 3.5–5.2)
Sodium: 138 mmol/L (ref 134–144)

## 2017-12-03 ENCOUNTER — Ambulatory Visit
Admission: RE | Admit: 2017-12-03 | Discharge: 2017-12-03 | Disposition: A | Discharge status: Home / Self Care | Payer: Medicare Other | Source: Ambulatory Visit | Attending: Obstetrics & Gynecology | Admitting: Obstetrics & Gynecology

## 2017-12-03 ENCOUNTER — Other Ambulatory Visit: Payer: Medicare Other

## 2017-12-03 DIAGNOSIS — R1031 Right lower quadrant pain: Secondary | ICD-10-CM

## 2017-12-03 MED ORDER — IOPAMIDOL (ISOVUE-300) INJECTION 61%
100.0000 mL | Freq: Once | INTRAVENOUS | Status: AC | PRN
  Administered 2017-12-03: 100 mL via INTRAVENOUS

## 2017-12-04 ENCOUNTER — Telehealth: Payer: Self-pay | Admitting: *Deleted

## 2017-12-04 NOTE — Telephone Encounter (Signed)
Notes recorded by Burnice Logan, RN on 12/04/2017 at 11:45 AM EST Left message to call Sharee Pimple at [phone].

## 2017-12-04 NOTE — Telephone Encounter (Signed)
-----   Message from Megan Salon, MD sent at 12/04/2017  8:36 AM EST ----- Could you please call pt and let her know the CT did not clearly show any cause of the pain.  I'd like her to see GI as this pain is worrisome to me.  Who was the last GI she saw?

## 2017-12-04 NOTE — Telephone Encounter (Signed)
Spoke with patient, advised of appointment details as seen below for Dr. Cristina Gong. Patient verbalizes understanding and is agreeable.   Routing to provider for final review. Patient is agreeable to disposition. Will close encounter.

## 2017-12-04 NOTE — Telephone Encounter (Signed)
Spoke with patient, advised as seen below per Dr. Sabra Heck. Patient states she has seen Dr. Cristina Gong at [place]. Advised patient I will call to schedule OV and return call with appointment details. Patient agreeable.

## 2017-12-04 NOTE — Telephone Encounter (Signed)
Spoke with Museum/gallery conservator at [employer] Patient scheduled for first available with Dr. Cristina Gong on 12/25/17 arriving at 10:30am for 10:45am appt. Fax OV notes, labs, imaging to [phone].  Requested information faxed.

## 2017-12-07 ENCOUNTER — Telehealth: Payer: Self-pay | Admitting: Obstetrics & Gynecology

## 2017-12-07 NOTE — Telephone Encounter (Signed)
Spoke with patient and she wanted to verify appointment date with Dr.Buccini. She misplaced her note with date. Advised patient she has appointment 12-25-17 arrive 10:30 for 10:45 appointment. She thanked me for returning her call.

## 2017-12-07 NOTE — Telephone Encounter (Signed)
Patient is asking to talk with Dr.Miller's nurse about her appointment with Dr.Buccini.

## 2017-12-25 ENCOUNTER — Other Ambulatory Visit: Payer: Self-pay | Admitting: Gastroenterology

## 2017-12-25 DIAGNOSIS — R1031 Right lower quadrant pain: Secondary | ICD-10-CM

## 2017-12-31 ENCOUNTER — Other Ambulatory Visit: Payer: Medicare Other

## 2018-01-03 ENCOUNTER — Ambulatory Visit
Admission: RE | Admit: 2018-01-03 | Discharge: 2018-01-03 | Disposition: A | Discharge status: Home / Self Care | Payer: Medicare Other | Source: Ambulatory Visit | Attending: Gastroenterology | Admitting: Gastroenterology

## 2018-01-03 ENCOUNTER — Other Ambulatory Visit: Payer: Self-pay | Admitting: Gastroenterology

## 2018-01-03 DIAGNOSIS — R1031 Right lower quadrant pain: Secondary | ICD-10-CM

## 2018-03-19 ENCOUNTER — Ambulatory Visit: Payer: Medicare Other | Admitting: Family Medicine

## 2020-10-02 DEATH — deceased
